# Patient Record
Sex: Male | Born: 1995 | Race: White | Hispanic: No | Marital: Single | Smoking: Current every day smoker
Health system: Southern US, Community
[De-identification: ages and names within clinical notes are randomized; demographics above are authoritative.]

## PROBLEM LIST (undated history)

## (undated) DIAGNOSIS — S42021A Displaced fracture of shaft of right clavicle, initial encounter for closed fracture: Secondary | ICD-10-CM

---

## 2018-04-01 ENCOUNTER — Emergency Department (HOSPITAL_COMMUNITY): Payer: Managed Care, Other (non HMO)

## 2018-04-01 ENCOUNTER — Encounter (HOSPITAL_COMMUNITY): Payer: Self-pay | Admitting: *Deleted

## 2018-04-01 ENCOUNTER — Other Ambulatory Visit: Payer: Self-pay

## 2018-04-01 ENCOUNTER — Inpatient Hospital Stay (HOSPITAL_COMMUNITY)
Admission: EM | Admit: 2018-04-01 | Discharge: 2018-04-04 | DRG: 516 | Disposition: A | Discharge status: Home / Self Care | Payer: Managed Care, Other (non HMO) | Attending: Surgery | Admitting: Surgery

## 2018-04-01 DIAGNOSIS — S2249XA Multiple fractures of ribs, unspecified side, initial encounter for closed fracture: Secondary | ICD-10-CM

## 2018-04-01 DIAGNOSIS — F17200 Nicotine dependence, unspecified, uncomplicated: Secondary | ICD-10-CM | POA: Diagnosis present

## 2018-04-01 DIAGNOSIS — S42031A Displaced fracture of lateral end of right clavicle, initial encounter for closed fracture: Secondary | ICD-10-CM

## 2018-04-01 DIAGNOSIS — S42021A Displaced fracture of shaft of right clavicle, initial encounter for closed fracture: Secondary | ICD-10-CM | POA: Diagnosis present

## 2018-04-01 DIAGNOSIS — Y92410 Unspecified street and highway as the place of occurrence of the external cause: Secondary | ICD-10-CM | POA: Diagnosis not present

## 2018-04-01 DIAGNOSIS — S2241XA Multiple fractures of ribs, right side, initial encounter for closed fracture: Secondary | ICD-10-CM | POA: Diagnosis present

## 2018-04-01 DIAGNOSIS — S27321A Contusion of lung, unilateral, initial encounter: Secondary | ICD-10-CM | POA: Diagnosis present

## 2018-04-01 DIAGNOSIS — Z419 Encounter for procedure for purposes other than remedying health state, unspecified: Secondary | ICD-10-CM

## 2018-04-01 HISTORY — DX: Displaced fracture of shaft of right clavicle, initial encounter for closed fracture: S42.021A

## 2018-04-01 LAB — COMPREHENSIVE METABOLIC PANEL
ALT: 30 U/L (ref 17–63)
AST: 34 U/L (ref 15–41)
Albumin: 4.2 g/dL (ref 3.5–5.0)
Alkaline Phosphatase: 38 U/L (ref 38–126)
Anion gap: 13 (ref 5–15)
BUN: 12 mg/dL (ref 6–20)
CO2: 22 mmol/L (ref 22–32)
CREATININE: 1.2 mg/dL (ref 0.61–1.24)
Calcium: 9 mg/dL (ref 8.9–10.3)
Chloride: 106 mmol/L (ref 101–111)
Glucose, Bld: 125 mg/dL — ABNORMAL HIGH (ref 65–99)
POTASSIUM: 3.6 mmol/L (ref 3.5–5.1)
Sodium: 141 mmol/L (ref 135–145)
Total Bilirubin: 0.9 mg/dL (ref 0.3–1.2)
Total Protein: 7.3 g/dL (ref 6.5–8.1)

## 2018-04-01 LAB — TYPE AND SCREEN
ABO/RH(D): A POS
Antibody Screen: NEGATIVE

## 2018-04-01 LAB — I-STAT CHEM 8, ED
BUN: 14 mg/dL (ref 6–20)
CALCIUM ION: 1.17 mmol/L (ref 1.15–1.40)
Chloride: 107 mmol/L (ref 101–111)
Creatinine, Ser: 1.1 mg/dL (ref 0.61–1.24)
Glucose, Bld: 121 mg/dL — ABNORMAL HIGH (ref 65–99)
HCT: 47 % (ref 39.0–52.0)
Hemoglobin: 16 g/dL (ref 13.0–17.0)
Potassium: 3.6 mmol/L (ref 3.5–5.1)
SODIUM: 145 mmol/L (ref 135–145)
TCO2: 22 mmol/L (ref 22–32)

## 2018-04-01 LAB — LIPASE, BLOOD: LIPASE: 40 U/L (ref 11–51)

## 2018-04-01 MED ORDER — ONDANSETRON HCL 4 MG/2ML IJ SOLN
4.0000 mg | Freq: Once | INTRAMUSCULAR | Status: AC
  Administered 2018-04-01: 4 mg via INTRAVENOUS
  Filled 2018-04-01: qty 2

## 2018-04-01 MED ORDER — IOHEXOL 300 MG/ML  SOLN
100.0000 mL | Freq: Once | INTRAMUSCULAR | Status: AC | PRN
  Administered 2018-04-01: 100 mL via INTRAVENOUS

## 2018-04-01 MED ORDER — TETANUS-DIPHTH-ACELL PERTUSSIS 5-2.5-18.5 LF-MCG/0.5 IM SUSP
0.5000 mL | Freq: Once | INTRAMUSCULAR | Status: AC
  Administered 2018-04-01: 0.5 mL via INTRAMUSCULAR
  Filled 2018-04-01: qty 0.5

## 2018-04-01 MED ORDER — HYDROMORPHONE HCL 2 MG/ML IJ SOLN
1.0000 mg | Freq: Once | INTRAMUSCULAR | Status: AC
  Administered 2018-04-01: 1 mg via INTRAVENOUS
  Filled 2018-04-01: qty 1
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
## 2018-04-01 NOTE — ED Notes (Addendum)
Dr. Clarene DukeLittle at bedside.  Ortho paged for shoulder immobilizer.  C-collar removed by Dr. Clarene DukeLittle.  Per Dr. Clarene DukeLittle pt can have something to drink.  Awaiting on trauma/admitting MD.
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
## 2018-04-01 NOTE — ED Notes (Signed)
Ortho at bedside.  Pt given sprite to drink.  Parents remain at bedside.

## 2018-04-01 NOTE — ED Notes (Signed)
Pain med given  pts parfents here in room  Personal belongings given to father including his wallet and glasses

## 2018-04-01 NOTE — ED Notes (Signed)
bp 115/80 p90

## 2018-04-01 NOTE — ED Notes (Signed)
Report given to 5N. Tamela Oddienae, RN
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
## 2018-04-01 NOTE — ED Notes (Signed)
Painful rt ribs

## 2018-04-01 NOTE — ED Notes (Addendum)
[place]  Portable clavicle x-ray being completed at this time.

## 2018-04-01 NOTE — ED Notes (Signed)
Portable chest and pelvis 
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
## 2018-04-01 NOTE — ED Notes (Addendum)
Pt states he is claustrophobic.  Upon talking with pt more appears to be nauseous.  Notified Dr. Clarene DukeLittle.    Elevated HOB.

## 2018-04-01 NOTE — ED Notes (Signed)
#  1 attempt calling report. RN unable to take report due to RN just getting to the floor. Will call again.

## 2018-04-01 NOTE — ED Notes (Signed)
C/o rt shoulder pain and clavicle pain abrasion to lower legs  Pt admits to smoking pot today and every day  Given morphine 6mg  on the way here by [place] [facility]

## 2018-04-01 NOTE — ED Notes (Signed)
Pt to CT.  [facility] speaking with parents in pt's room.

## 2018-04-02 ENCOUNTER — Inpatient Hospital Stay (HOSPITAL_COMMUNITY): Payer: Managed Care, Other (non HMO)

## 2018-04-02 ENCOUNTER — Encounter (HOSPITAL_COMMUNITY): Payer: Self-pay | Admitting: Orthopedic Surgery

## 2018-04-02 DIAGNOSIS — S42021A Displaced fracture of shaft of right clavicle, initial encounter for closed fracture: Secondary | ICD-10-CM | POA: Diagnosis present

## 2018-04-02 HISTORY — DX: Displaced fracture of shaft of right clavicle, initial encounter for closed fracture: S42.021A

## 2018-04-02 LAB — CBC
HCT: 42.6 % (ref 39.0–52.0)
Hemoglobin: 14.3 g/dL (ref 13.0–17.0)
MCH: 28.1 pg (ref 26.0–34.0)
MCHC: 33.6 g/dL (ref 30.0–36.0)
MCV: 83.9 fL (ref 78.0–100.0)
PLATELETS: 154 10*3/uL (ref 150–400)
RBC: 5.08 MIL/uL (ref 4.22–5.81)
RDW: 13.2 % (ref 11.5–15.5)
WBC: 17.3 10*3/uL — AB (ref 4.0–10.5)

## 2018-04-02 LAB — HIV ANTIBODY (ROUTINE TESTING W REFLEX): HIV Screen 4th Generation wRfx: NONREACTIVE

## 2018-04-02 LAB — BASIC METABOLIC PANEL
ANION GAP: 8 (ref 5–15)
BUN: 10 mg/dL (ref 6–20)
CALCIUM: 8.8 mg/dL — AB (ref 8.9–10.3)
CO2: 24 mmol/L (ref 22–32)
Chloride: 108 mmol/L (ref 101–111)
Creatinine, Ser: 0.94 mg/dL (ref 0.61–1.24)
GFR calc Af Amer: >60 mL/min (ref >60)
GLUCOSE: 108 mg/dL — AB (ref 65–99)
POTASSIUM: 3.6 mmol/L (ref 3.5–5.1)
SODIUM: 140 mmol/L (ref 135–145)

## 2018-04-02 LAB — MRSA PCR SCREENING: MRSA by PCR: NEGATIVE

## 2018-04-02 LAB — ABO/RH: ABO/RH(D): A POS

## 2018-04-02 MED ORDER — POVIDONE-IODINE 10 % EX SWAB: 2.0000 "application " | Freq: Once | CUTANEOUS | Status: DC

## 2018-04-02 MED ORDER — ONDANSETRON 4 MG PO TBDP
4.0000 mg | ORAL_TABLET | Freq: Four times a day (QID) | ORAL | Status: DC | PRN
  Filled 2018-04-02: qty 1

## 2018-04-02 MED ORDER — MORPHINE SULFATE (PF) 4 MG/ML IV SOLN
4.0000 mg | INTRAVENOUS | Status: DC | PRN
  Administered 2018-04-02: 4 mg via INTRAVENOUS
  Filled 2018-04-02 (×2): qty 1

## 2018-04-02 MED ORDER — OXYCODONE HCL 5 MG PO TABS
10.0000 mg | ORAL_TABLET | ORAL | Status: DC | PRN
  Administered 2018-04-03 – 2018-04-04 (×3): 10 mg via ORAL
  Filled 2018-04-02 (×3): qty 2

## 2018-04-02 MED ORDER — OXYCODONE HCL 5 MG PO TABS
5.0000 mg | ORAL_TABLET | ORAL | Status: DC | PRN
  Filled 2018-04-02: qty 1

## 2018-04-02 MED ORDER — MORPHINE SULFATE (PF) 2 MG/ML IV SOLN
2.0000 mg | INTRAVENOUS | Status: DC | PRN
  Administered 2018-04-02 – 2018-04-03 (×5): 4 mg via INTRAVENOUS
  Filled 2018-04-02 (×5): qty 2

## 2018-04-02 MED ORDER — ACETAMINOPHEN 325 MG PO TABS: 650.0000 mg | ORAL_TABLET | Freq: Four times a day (QID) | ORAL | Status: DC | PRN

## 2018-04-02 MED ORDER — DEXTROSE 5 % IV SOLN
3.0000 g | INTRAVENOUS | Status: AC
  Filled 2018-04-02: qty 3000

## 2018-04-02 MED ORDER — SODIUM CHLORIDE 0.9 % IV SOLN
INTRAVENOUS | Status: DC
  Administered 2018-04-02 – 2018-04-03 (×2): via INTRAVENOUS

## 2018-04-02 MED ORDER — METHOCARBAMOL 500 MG PO TABS
500.0000 mg | ORAL_TABLET | Freq: Three times a day (TID) | ORAL | Status: DC | PRN
  Filled 2018-04-02: qty 1

## 2018-04-02 MED ORDER — MORPHINE SULFATE (PF) 2 MG/ML IV SOLN: 2.0000 mg | INTRAVENOUS | Status: DC | PRN

## 2018-04-02 MED ORDER — CHLORHEXIDINE GLUCONATE 4 % EX LIQD
60.0000 mL | Freq: Once | CUTANEOUS | Status: AC
  Administered 2018-04-02: 4 via TOPICAL
  Filled 2018-04-02: qty 15
  Filled 2018-04-02: qty 60

## 2018-04-02 MED ORDER — ONDANSETRON HCL 4 MG/2ML IJ SOLN: 4.0000 mg | Freq: Four times a day (QID) | INTRAMUSCULAR | Status: DC | PRN

## 2018-04-02 MED ORDER — ENOXAPARIN SODIUM 40 MG/0.4ML ~~LOC~~ SOLN
40.0000 mg | SUBCUTANEOUS | Status: DC
  Administered 2018-04-02: 40 mg via SUBCUTANEOUS
  Filled 2018-04-02: qty 0.4

## 2018-04-02 MED ORDER — MUPIROCIN 2 % EX OINT
1.0000 "application " | TOPICAL_OINTMENT | Freq: Two times a day (BID) | CUTANEOUS | Status: AC
  Filled 2018-04-02: qty 22

## 2018-04-02 MED ORDER — MORPHINE SULFATE (PF) 2 MG/ML IV SOLN: 1.0000 mg | INTRAVENOUS | Status: DC | PRN
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
## 2018-04-02 NOTE — Progress Notes (Signed)
Plan surgery tues am midmorning.   Ok to eat now, npo after midnight.  Updated family on plan.  Eulas PostJoshua P Darshawn Boateng, MD
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
## 2018-04-03 ENCOUNTER — Inpatient Hospital Stay (HOSPITAL_COMMUNITY): Payer: Managed Care, Other (non HMO) | Admitting: Anesthesiology

## 2018-04-03 ENCOUNTER — Encounter (HOSPITAL_COMMUNITY): Payer: Self-pay | Admitting: Certified Registered Nurse Anesthetist

## 2018-04-03 ENCOUNTER — Encounter (HOSPITAL_COMMUNITY): Admission: EM | Disposition: A | Discharge status: Home / Self Care | Payer: Self-pay | Source: Home / Self Care

## 2018-04-03 ENCOUNTER — Inpatient Hospital Stay (HOSPITAL_COMMUNITY): Payer: Managed Care, Other (non HMO)

## 2018-04-03 HISTORY — PX: ORIF CLAVICULAR FRACTURE: SHX5055

## 2018-04-03 LAB — BASIC METABOLIC PANEL
Anion gap: 11 (ref 5–15)
BUN: 8 mg/dL (ref 6–20)
CALCIUM: 8.8 mg/dL — AB (ref 8.9–10.3)
CO2: 25 mmol/L (ref 22–32)
CREATININE: 0.95 mg/dL (ref 0.61–1.24)
Chloride: 106 mmol/L (ref 101–111)
GFR calc Af Amer: >60 mL/min (ref >60)
GLUCOSE: 88 mg/dL (ref 65–99)
Potassium: 3.5 mmol/L (ref 3.5–5.1)
SODIUM: 142 mmol/L (ref 135–145)

## 2018-04-03 LAB — CBC
HCT: 41.3 % (ref 39.0–52.0)
Hemoglobin: 13.9 g/dL (ref 13.0–17.0)
MCH: 28.5 pg (ref 26.0–34.0)
MCHC: 33.7 g/dL (ref 30.0–36.0)
MCV: 84.6 fL (ref 78.0–100.0)
PLATELETS: 135 10*3/uL — AB (ref 150–400)
RBC: 4.88 MIL/uL (ref 4.22–5.81)
RDW: 13 % (ref 11.5–15.5)
WBC: 8.5 10*3/uL (ref 4.0–10.5)

## 2018-04-03 SURGERY — OPEN REDUCTION INTERNAL FIXATION (ORIF) CLAVICULAR FRACTURE
Anesthesia: General | Site: Shoulder | Laterality: Right

## 2018-04-03 MED ORDER — ONDANSETRON HCL 4 MG/2ML IJ SOLN
INTRAMUSCULAR | Status: AC
  Filled 2018-04-03: qty 2

## 2018-04-03 MED ORDER — 0.9 % SODIUM CHLORIDE (POUR BTL) OPTIME
TOPICAL | Status: DC | PRN
  Administered 2018-04-03: 1000 mL

## 2018-04-03 MED ORDER — BUPIVACAINE-EPINEPHRINE (PF) 0.25% -1:200000 IJ SOLN
INTRAMUSCULAR | Status: AC
  Filled 2018-04-03: qty 30

## 2018-04-03 MED ORDER — BUPIVACAINE-EPINEPHRINE 0.25% -1:200000 IJ SOLN
INTRAMUSCULAR | Status: DC | PRN
  Administered 2018-04-03: 20 mL

## 2018-04-03 MED ORDER — LACTATED RINGERS IV SOLN
INTRAVENOUS | Status: DC
  Administered 2018-04-03 (×3): via INTRAVENOUS

## 2018-04-03 MED ORDER — CEFAZOLIN SODIUM-DEXTROSE 2-4 GM/100ML-% IV SOLN
2.0000 g | Freq: Three times a day (TID) | INTRAVENOUS | Status: AC
  Administered 2018-04-03 (×2): 2 g via INTRAVENOUS
  Filled 2018-04-03 (×2): qty 100

## 2018-04-03 MED ORDER — DEXAMETHASONE SODIUM PHOSPHATE 10 MG/ML IJ SOLN
INTRAMUSCULAR | Status: DC | PRN
  Administered 2018-04-03: 10 mg via INTRAVENOUS

## 2018-04-03 MED ORDER — FENTANYL CITRATE (PF) 250 MCG/5ML IJ SOLN
INTRAMUSCULAR | Status: AC
  Filled 2018-04-03: qty 5

## 2018-04-03 MED ORDER — PROPOFOL 10 MG/ML IV BOLUS
INTRAVENOUS | Status: AC
  Filled 2018-04-03: qty 20

## 2018-04-03 MED ORDER — ACETAMINOPHEN 500 MG PO TABS
1000.0000 mg | ORAL_TABLET | Freq: Three times a day (TID) | ORAL | Status: DC
  Administered 2018-04-03 – 2018-04-04 (×3): 1000 mg via ORAL
  Filled 2018-04-03 (×3): qty 2

## 2018-04-03 MED ORDER — ROCURONIUM BROMIDE 50 MG/5ML IV SOLN
INTRAVENOUS | Status: AC
  Filled 2018-04-03: qty 1

## 2018-04-03 MED ORDER — SUGAMMADEX SODIUM 500 MG/5ML IV SOLN
INTRAVENOUS | Status: DC | PRN
  Administered 2018-04-03: 235.8 mg via INTRAVENOUS

## 2018-04-03 MED ORDER — FENTANYL CITRATE (PF) 100 MCG/2ML IJ SOLN
INTRAMUSCULAR | Status: DC | PRN
  Administered 2018-04-03: 50 ug via INTRAVENOUS
  Administered 2018-04-03 (×2): 100 ug via INTRAVENOUS

## 2018-04-03 MED ORDER — CEFAZOLIN SODIUM-DEXTROSE 2-3 GM-%(50ML) IV SOLR
INTRAVENOUS | Status: DC | PRN
  Administered 2018-04-03: 2 g via INTRAVENOUS

## 2018-04-03 MED ORDER — ONDANSETRON HCL 4 MG/2ML IJ SOLN
INTRAMUSCULAR | Status: DC | PRN
  Administered 2018-04-03: 4 mg via INTRAVENOUS

## 2018-04-03 MED ORDER — DIPHENHYDRAMINE HCL 50 MG/ML IJ SOLN
INTRAMUSCULAR | Status: DC | PRN
  Administered 2018-04-03: 12.5 mg via INTRAVENOUS

## 2018-04-03 MED ORDER — PROPOFOL 10 MG/ML IV BOLUS
INTRAVENOUS | Status: DC | PRN
  Administered 2018-04-03: 250 mg via INTRAVENOUS

## 2018-04-03 MED ORDER — ROCURONIUM BROMIDE 10 MG/ML (PF) SYRINGE
PREFILLED_SYRINGE | INTRAVENOUS | Status: DC | PRN
  Administered 2018-04-03: 50 mg via INTRAVENOUS

## 2018-04-03 MED ORDER — METHOCARBAMOL 500 MG PO TABS
500.0000 mg | ORAL_TABLET | Freq: Three times a day (TID) | ORAL | Status: DC
  Administered 2018-04-03 – 2018-04-04 (×3): 500 mg via ORAL
  Filled 2018-04-03 (×3): qty 1

## 2018-04-03 MED ORDER — LIDOCAINE HCL (CARDIAC) PF 100 MG/5ML IV SOSY
PREFILLED_SYRINGE | INTRAVENOUS | Status: DC | PRN
  Administered 2018-04-03: 60 mg via INTRAVENOUS

## 2018-04-03 MED ORDER — SUCCINYLCHOLINE CHLORIDE 200 MG/10ML IV SOSY
PREFILLED_SYRINGE | INTRAVENOUS | Status: AC
  Filled 2018-04-03: qty 10

## 2018-04-03 MED ORDER — MIDAZOLAM HCL 2 MG/2ML IJ SOLN
INTRAMUSCULAR | Status: AC
  Filled 2018-04-03: qty 2

## 2018-04-03 MED ORDER — MIDAZOLAM HCL 5 MG/5ML IJ SOLN
INTRAMUSCULAR | Status: DC | PRN
  Administered 2018-04-03: 2 mg via INTRAVENOUS

## 2018-04-03 MED ORDER — CEFAZOLIN SODIUM-DEXTROSE 2-4 GM/100ML-% IV SOLN
INTRAVENOUS | Status: AC
  Filled 2018-04-03: qty 100

## 2018-04-03 SURGICAL SUPPLY — 50 items
BENZOIN TINCTURE PRP APPL 2/3 (GAUZE/BANDAGES/DRESSINGS) ×3 IMPLANT
BIT DRILL 2.8X5 QR DISP (BIT) ×3 IMPLANT
CLEANER TIP ELECTROSURG 2X2 (MISCELLANEOUS) IMPLANT
CLOSURE STERI-STRIP 1/2X4 (GAUZE/BANDAGES/DRESSINGS) ×1
CLSR STERI-STRIP ANTIMIC 1/2X4 (GAUZE/BANDAGES/DRESSINGS) ×2 IMPLANT
COVER SURGICAL LIGHT HANDLE (MISCELLANEOUS) ×3 IMPLANT
DRAPE C-ARM 42X72 X-RAY (DRAPES) ×3 IMPLANT
DRAPE IMP U-DRAPE 54X76 (DRAPES) ×3 IMPLANT
DRAPE INCISE IOBAN 66X45 STRL (DRAPES) ×3 IMPLANT
DRAPE U-SHAPE 47X51 STRL (DRAPES) ×3 IMPLANT
DRSG MEPILEX BORDER 4X8 (GAUZE/BANDAGES/DRESSINGS) ×3 IMPLANT
DURAPREP 26ML APPLICATOR (WOUND CARE) ×3 IMPLANT
ELECT REM PT RETURN 9FT ADLT (ELECTROSURGICAL)
ELECTRODE REM PT RTRN 9FT ADLT (ELECTROSURGICAL) IMPLANT
GAUZE SPONGE 4X4 12PLY STRL (GAUZE/BANDAGES/DRESSINGS) ×6 IMPLANT
GAUZE XEROFORM 1X8 LF (GAUZE/BANDAGES/DRESSINGS) ×3 IMPLANT
GLOVE BIOGEL PI ORTHO PRO SZ8 (GLOVE) ×2
GLOVE ORTHO TXT STRL SZ7.5 (GLOVE) ×6 IMPLANT
GLOVE PI ORTHO PRO STRL SZ8 (GLOVE) ×1 IMPLANT
GLOVE SURG ORTHO 8.0 STRL STRW (GLOVE) ×6 IMPLANT
GOWN STRL REUS W/ TWL LRG LVL3 (GOWN DISPOSABLE) IMPLANT
GOWN STRL REUS W/TWL LRG LVL3 (GOWN DISPOSABLE)
KIT BASIN OR (CUSTOM PROCEDURE TRAY) ×3 IMPLANT
KIT TURNOVER KIT B (KITS) ×3 IMPLANT
MANIFOLD NEPTUNE II (INSTRUMENTS) ×3 IMPLANT
NEEDLE HYPO 25GX1X1/2 BEV (NEEDLE) ×3 IMPLANT
NS IRRIG 1000ML POUR BTL (IV SOLUTION) ×3 IMPLANT
PACK SHOULDER (CUSTOM PROCEDURE TRAY) ×3 IMPLANT
PACK UNIVERSAL I (CUSTOM PROCEDURE TRAY) ×3 IMPLANT
PAD ARMBOARD 7.5X6 YLW CONV (MISCELLANEOUS) ×6 IMPLANT
PLATE 6 H SM LOCKING RT CLAV (Plate) ×3 IMPLANT
SCREW LOCK 12X3.5X HEXALOBE (Screw) ×2 IMPLANT
SCREW LOCKING 3.5X10MM (Screw) ×3 IMPLANT
SCREW LOCKING 3.5X12 (Screw) ×4 IMPLANT
SCREW NON LOCK 3.5X10MM (Screw) ×6 IMPLANT
SCREW NONLOCK HEX 3.5X12 (Screw) ×3 IMPLANT
SPONGE LAP 4X18 RFD (DISPOSABLE) IMPLANT
STAPLER VISISTAT 35W (STAPLE) IMPLANT
SUCTION FRAZIER HANDLE 10FR (MISCELLANEOUS) ×2
SUCTION TUBE FRAZIER 10FR DISP (MISCELLANEOUS) ×1 IMPLANT
SUT FIBERWIRE #2 38 T-5 BLUE (SUTURE)
SUT VIC AB 0 CTB1 27 (SUTURE) IMPLANT
SUT VIC AB 2-0 CT1 36 (SUTURE) ×3 IMPLANT
SUT VIC AB 3-0 FS2 27 (SUTURE) ×3 IMPLANT
SUTURE FIBERWR #2 38 T-5 BLUE (SUTURE) IMPLANT
SYR BULB IRRIGATION 50ML (SYRINGE) ×3 IMPLANT
SYR CONTROL 10ML LL (SYRINGE) ×3 IMPLANT
TOWEL OR 17X24 6PK STRL BLUE (TOWEL DISPOSABLE) ×3 IMPLANT
TOWEL OR 17X26 10 PK STRL BLUE (TOWEL DISPOSABLE) ×3 IMPLANT
WATER STERILE IRR 1000ML POUR (IV SOLUTION) ×3 IMPLANT

## 2018-04-03 NOTE — Progress Notes (Signed)
Betadine swab complete  

## 2018-04-03 NOTE — Transfer of Care (Signed)
Immediate Anesthesia Transfer of Care Note  Patient: Charles Duran  Procedure(s) Performed: OPEN REDUCTION INTERNAL FIXATION (ORIF) CLAVICULAR FRACTURE (Right Shoulder)  Patient Location: PACU  Anesthesia Type:General  Level of Consciousness: awake and alert   Airway & Oxygen Therapy: Patient Spontanous Breathing  Post-op Assessment: Report given to RN and Post -op Vital signs reviewed and stable  Post vital signs: Reviewed and stable  Last Vitals:  Vitals Value Taken Time  BP    Temp    Pulse 105 04/03/2018 10:50 AM  Resp 20 04/03/2018 10:50 AM  SpO2 97 % 04/03/2018 10:50 AM  Vitals shown include unvalidated device data.  Last Pain:  Vitals:   04/03/18 1047  TempSrc:   PainSc: (P) Asleep         Complications: No apparent anesthesia complications

## 2018-04-03 NOTE — Anesthesia Preprocedure Evaluation (Signed)
Anesthesia Evaluation  Patient identified by MRN, date of birth, ID band Patient awake    Reviewed: Allergy & Precautions, NPO status , Patient's Chart, lab work & pertinent test results  Airway Mallampati: II  TM Distance: >3 FB Neck ROM: Full    Dental no notable dental hx.    Pulmonary Current Smoker,    Pulmonary exam normal breath sounds clear to auscultation       Cardiovascular negative cardio ROS Normal cardiovascular exam Rhythm:Regular Rate:Normal     Neuro/Psych negative neurological ROS  negative psych ROS   GI/Hepatic negative GI ROS, Neg liver ROS,   Endo/Other  negative endocrine ROS  Renal/GU negative Renal ROS  negative genitourinary   Musculoskeletal negative musculoskeletal ROS (+)   Abdominal   Peds negative pediatric ROS (+)  Hematology negative hematology ROS (+)   Anesthesia Other Findings   Reproductive/Obstetrics negative OB ROS                             Anesthesia Physical Anesthesia Plan  ASA: II  Anesthesia Plan: General   Post-op Pain Management:    Induction: Intravenous  PONV Risk Score and Plan: 2 and Ondansetron, Dexamethasone and Treatment may vary due to age or medical condition  Airway Management Planned: Oral ETT  Additional Equipment:   Intra-op Plan:   Post-operative Plan: Extubation in OR  Informed Consent: I have reviewed the patients History and Physical, chart, labs and discussed the procedure including the risks, benefits and alternatives for the proposed anesthesia with the patient or authorized representative who has indicated his/her understanding and acceptance.   Dental advisory given  Plan Discussed with: CRNA and Surgeon  Anesthesia Plan Comments:         Anesthesia Quick Evaluation  

## 2018-04-03 NOTE — Progress Notes (Signed)
The patient has been re-examined, and the chart reviewed, and there have been no interval changes to the documented history and physical.    The risks benefits and alternatives were discussed with the patient including but not limited to the risks of nonoperative treatment, versus surgical intervention including infection, bleeding, nerve injury, malunion, nonunion, the need for revision surgery, hardware prominence, hardware failure, the need for hardware removal, blood clots, cardiopulmonary complications, morbidity, mortality, among others, and they were willing to proceed.    Karmine Kauer P Chestine Belknap, MD  
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
## 2018-04-03 NOTE — Anesthesia Procedure Notes (Signed)
Procedure Name: Intubation Date/Time: 04/03/2018 8:40 AM Performed by: Carney Living, CRNA Pre-anesthesia Checklist: Patient identified, Emergency Drugs available, Suction available, Patient being monitored and Timeout performed Patient Re-evaluated:Patient Re-evaluated prior to induction Oxygen Delivery Method: Circle system utilized Preoxygenation: Pre-oxygenation with 100% oxygen Induction Type: IV induction Ventilation: Mask ventilation without difficulty Laryngoscope Size: Mac and 4 Grade View: Grade I Tube size: 7.5 mm Number of attempts: 1 Airway Equipment and Method: Stylet Placement Confirmation: ETT inserted through vocal cords under direct vision,  positive ETCO2 and breath sounds checked- equal and bilateral Secured at: 23 cm Tube secured with: Tape Dental Injury: Teeth and Oropharynx as per pre-operative assessment

## 2018-04-03 NOTE — Anesthesia Postprocedure Evaluation (Signed)
Anesthesia Post Note  Patient: [name]  Procedure(s) Performed: OPEN REDUCTION INTERNAL FIXATION (ORIF) CLAVICULAR FRACTURE (Right Shoulder)     Patient location during evaluation: PACU Anesthesia Type: General Level of consciousness: awake and alert Pain management: pain level controlled Vital Signs Assessment: post-procedure vital signs reviewed and stable Respiratory status: spontaneous breathing, nonlabored ventilation, respiratory function stable and patient connected to nasal cannula oxygen Cardiovascular status: blood pressure returned to baseline and stable Postop Assessment: no apparent nausea or vomiting Anesthetic complications: no    Last Vitals:  Vitals:   04/03/18 1140 04/03/18 1406  BP: (!) 144/85 (!) 141/76  Pulse:  62  Resp:    Temp: 36.7 C 36.8 C  SpO2:  96%    Last Pain:  Vitals:   04/03/18 1406  TempSrc: Oral  PainSc:                  Allyna Pittsley S

## 2018-04-03 NOTE — Op Note (Signed)
04/01/2018 - 04/03/2018  10:26 AM  PATIENT:  Charles Duran    PRE-OPERATIVE DIAGNOSIS:  Right Fractured Clavicle  POST-OPERATIVE DIAGNOSIS:  Same  PROCEDURE:  OPEN REDUCTION INTERNAL FIXATION (ORIF) CLAVICULAR FRACTURE  SURGEON:  Eulas PostJoshua P Shantee Hayne, MD  PHYSICIAN ASSISTANT: Janace LittenBrandon Parry, OPA-C, present and scrubbed throughout the case, critical for completion in a timely fashion, and for retraction, instrumentation, and closure.  ANESTHESIA:   General  UNIQUE ASPECTS OF THE CASE:  There was significant midshaft comminution, I was able to get the bone ends opposed, although there were fragments both anteriorly and posteriorly.  Ultimately I ended up talking in these fragments after morselizing them and making him bone graft.  Additionally, the medial and lateral most screws measured 10, I measured them multiple times, however I think I was slightly off of the back on the medial screw, and possibly off of the back on the lateral screw, it felt like good bone and they had excellent purchase, the medial one was a locking screw, the lateral one was a nonlocking screw in order to get better plate opposition to the bone.  On plain films they look short but I believe that this is because they are not in the central portion of the clavicle.  There was also a large cortical flake anteriorly, which was used for bone graft.  ESTIMATED BLOOD LOSS: 50ml  PREOPERATIVE INDICATIONS:  Charles Duran is a  22 y.o. male with a diagnosis of Right Fractured Clavicle who elected for surgical management based on preoperative shortening and angulation and displacement of the fracture.    The risks benefits and alternatives were discussed with the patient preoperatively including but not limited to the risks of infection, bleeding, nerve injury, malunion, nonunion, hardware failure, the need for hardware removal, recurrent fracture, cardiopulmonary complications, the need for revision surgery, among others, and the  patient was willing to proceed.    OPERATIVE IMPLANTS: Acumed 6 hole clavicle plate, lateral screw was nonlocking, nonlocking, locking, moving medially across the fracture site locking, nonlocking, locking.  OPERATIVE FINDINGS: Shortened, displaced clavicle fracture with midshaft comminution.  OPERATIVE PROCEDURE: The patient was brought to the operating room and placed in the supine position. General anesthesia was administered. IV antibiotics were given. He was placed in the beach chair position. The upper extremity was prepped and draped in the usual sterile fashion. Time out was performed. Incision was made over the clavicle fracture. Dissection was carried down through the platysma, and the fracture site exposed. The fracture was extremely short.  I ultimately did however achieve satisfactory mobilization, and was able to reduce the fracture optimizing bony opposition, although the keys had been lost to comminution.   I had excellent bony apposition and restoration of anatomic alignment of the clavicle. Used C-arm to confirm appropriate alignment, reduction of the fracture, and positioning of the plate and length of the screws.  I then took final C-arm pictures, irrigated the wounds copiously, and repaired the fascia with inverted figure-of-eight Vicryl suture. The subcutaneous tissue was closed with Vicryl as well, and the skin closed with steri-strips, and the patient was awakened and returned to the PACU in stable and satisfactory condition. There were no complications.

## 2018-04-04 ENCOUNTER — Encounter (HOSPITAL_COMMUNITY): Payer: Self-pay | Admitting: Orthopedic Surgery

## 2018-04-04 MED ORDER — MUPIROCIN 2 % EX OINT
1.0000 | TOPICAL_OINTMENT | Freq: Two times a day (BID) | CUTANEOUS | 0 refills | Status: AC
Start: 2018-04-04 — End: ?

## 2018-04-04 MED ORDER — METHOCARBAMOL 500 MG PO TABS: 500.0000 mg | ORAL_TABLET | Freq: Three times a day (TID) | ORAL | 0 refills | Status: AC

## 2018-04-04 MED ORDER — OXYCODONE HCL 5 MG PO TABS: 5.0000 mg | ORAL_TABLET | Freq: Four times a day (QID) | ORAL | 0 refills | Status: AC | PRN

## 2018-04-04 MED ORDER — ACETAMINOPHEN 500 MG PO TABS: 1000.0000 mg | ORAL_TABLET | Freq: Three times a day (TID) | ORAL | 0 refills | Status: AC

## 2018-04-04 NOTE — Progress Notes (Signed)
Pt given discharge instructions with understanding. IV d/c and pt has no questions at this time. Pt called parents to room.

## 2018-04-04 NOTE — Plan of Care (Signed)
  Problem: Education: Goal: Knowledge of General Education information will improve Outcome: Completed/Met   Problem: Health Behavior/Discharge Planning: Goal: Ability to manage health-related needs will improve Outcome: Completed/Met   Problem: Clinical Measurements: Goal: Ability to maintain clinical measurements within normal limits will improve Outcome: Completed/Met Goal: Will remain free from infection Outcome: Completed/Met Goal: Diagnostic test results will improve Outcome: Completed/Met Goal: Respiratory complications will improve Outcome: Completed/Met Goal: Cardiovascular complication will be avoided Outcome: Completed/Met   Problem: Activity: Goal: Risk for activity intolerance will decrease Outcome: Completed/Met   Problem: Nutrition: Goal: Adequate nutrition will be maintained Outcome: Completed/Met   Problem: Coping: Goal: Level of anxiety will decrease Outcome: Completed/Met   Problem: Elimination: Goal: Will not experience complications related to bowel motility Outcome: Completed/Met Goal: Will not experience complications related to urinary retention Outcome: Completed/Met   Problem: Pain Managment: Goal: General experience of comfort will improve Outcome: Completed/Met   Problem: Safety: Goal: Ability to remain free from injury will improve Outcome: Completed/Met   Problem: Skin Integrity: Goal: Risk for impaired skin integrity will decrease Outcome: Completed/Met   Problem: Education: Goal: Knowledge of the prescribed therapeutic regimen will improve Outcome: Completed/Met Goal: Understanding of activity limitations/precautions following surgery will improve Outcome: Completed/Met   Problem: Activity: Goal: Ability to tolerate increased activity will improve Outcome: Completed/Met   Problem: Pain Management: Goal: Pain level will decrease with appropriate interventions Outcome: Completed/Met

## 2018-04-04 NOTE — Discharge Instructions (Signed)
Diet: As you were doing prior to hospitalization   Shower:  May shower but keep the wounds dry, use an occlusive plastic wrap, NO SOAKING IN TUB.  If the bandage gets wet, change with a clean dry gauze.  If you have a splint on, leave the splint in place and keep the splint dry with a plastic bag.  Dressing:  You may change your dressing 3-5 days after surgery, unless you have a splint.  If you have a splint, then just leave the splint in place and we will change your bandages during your first follow-up appointment.    If you had hand or foot surgery, we will plan to remove your stitches in about 2 weeks in the office.  For all other surgeries, there are sticky tapes (steri-strips) on your wounds and all the stitches are absorbable.  Leave the steri-strips in place when changing your dressings, they will peel off with time, usually 2-3 weeks.  Activity:  Increase activity slowly as tolerated, but follow the weight bearing instructions below.  The rules on driving is that you can not be taking narcotics while you drive, and you must feel in control of the vehicle.    Weight Bearing:   Sling at all times for right upper extremity.    To prevent constipation: you may use a stool softener such as -  Colace (over the counter) 100 mg by mouth twice a day  Drink plenty of fluids (prune juice may be helpful) and high fiber foods Miralax (over the counter) for constipation as needed.    Itching:  If you experience itching with your medications, try taking only a single pain pill, or even half a pain pill at a time.  You may take up to 10 pain pills per day, and you can also use benadryl over the counter for itching or also to help with sleep.   Precautions:  If you experience chest pain or shortness of breath - call 911 immediately for transfer to the hospital emergency department!!  If you develop a fever greater that 101 F, purulent drainage from wound, increased redness or drainage from wound, or  calf pain -- Call the office at [phone]                                                Follow- Up Appointment:  Please call for an appointment to be seen in 2 weeks [facility] - [phone]     Rib Fracture A rib fracture is a break or crack in one of the bones of the ribs. The ribs are like a cage that goes around your upper chest. A broken or cracked rib is often painful, but most do not cause other problems. Most rib fractures heal on their own in 1-3 months. Follow these instructions at home:  Avoid activities that cause pain to the injured area. Protect your injured area.  Slowly increase activity as told by your doctor.  Take medicine as told by your doctor.  Put ice on the injured area for the first 1-2 days after you have been treated or as told by your doctor. ? Put ice in a plastic bag. ? Place a towel between your skin and the bag. ? Leave the ice on for 15-20 minutes at a time, every 2 hours while you are awake.  Do deep breathing  as told by your doctor. You may be told to: ? Take deep breaths many times a day. ? Cough many times a day while hugging a pillow. ? Use a device (incentive spirometer) to perform deep breathing many times a day.  Drink enough fluids to keep your pee (urine) clear or pale yellow.  Do not wear a rib belt or binder. These do not allow you to breathe deeply. Get help right away if:  You have a fever.  You have trouble breathing.  You cannot stop coughing.  You cough up thick or bloody spit (mucus).  You feel sick to your stomach (nauseous), throw up (vomit), or have belly (abdominal) pain.  Your pain gets worse and medicine does not help. This information is not intended to replace advice given to you by your health care provider. Make sure you discuss any questions you have with your health care provider. Document Released: 07/12/2008 Document Revised: 03/10/2016 Document Reviewed: 12/05/2012 Elsevier Interactive Patient  Education  [employer]2018 [employer]
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
